# Patient Record
Sex: Female | Born: 1980 | Race: White | Hispanic: No | Marital: Married | Smoking: Never smoker
Health system: Southern US, Community
[De-identification: ages and names within clinical notes are randomized; demographics above are authoritative.]

## PROBLEM LIST (undated history)

## (undated) DIAGNOSIS — E119 Type 2 diabetes mellitus without complications: Secondary | ICD-10-CM

## (undated) DIAGNOSIS — F419 Anxiety disorder, unspecified: Secondary | ICD-10-CM

---

## 2011-11-06 ENCOUNTER — Emergency Department: Payer: Self-pay | Admitting: Emergency Medicine

## 2019-08-08 ENCOUNTER — Ambulatory Visit: Payer: Self-pay | Attending: Internal Medicine

## 2019-08-08 DIAGNOSIS — Z23 Encounter for immunization: Secondary | ICD-10-CM | POA: Insufficient documentation

## 2019-08-08 NOTE — Progress Notes (Signed)
   Covid-19 Vaccination Clinic  Name:  Courtney Fuentes    MRN: [id number] DOB: 1981/06/07  08/08/2019  Courtney Fuentes was observed post Covid-19 immunization for 15 minutes without incidence. She was provided with Vaccine Information Sheet and instruction to access the V-Safe system.   Courtney Fuentes was instructed to call 911 with any severe reactions post vaccine: Marland Kitchen Difficulty breathing  . Swelling of your face and throat  . A fast heartbeat  . A bad rash all over your body  . Dizziness and weakness    Immunizations Administered    Name Date Dose VIS Date Route   Pfizer COVID-19 Vaccine 08/08/2019  4:05 PM 0.3 mL 05/21/2019 Intramuscular   Manufacturer: [employer], [employer]   Lot: [id number]   NDC: [address]-8

## 2019-09-01 ENCOUNTER — Ambulatory Visit: Payer: Self-pay | Attending: Internal Medicine

## 2019-09-01 DIAGNOSIS — Z23 Encounter for immunization: Secondary | ICD-10-CM

## 2019-09-01 NOTE — Progress Notes (Signed)
   Covid-19 Vaccination Clinic  Name:  Courtney Fuentes    MRN: [id number] DOB: 18-Apr-1981  09/01/2019  [name] was observed post Covid-19 immunization for 15 minutes without incident. She was provided with Vaccine Information Sheet and instruction to access the V-Safe system.   [name] was instructed to call 911 with any severe reactions post vaccine: Marland Kitchen Difficulty breathing  . Swelling of face and throat  . A fast heartbeat  . A bad rash all over body  . Dizziness and weakness   Immunizations Administered    Name Date Dose VIS Date Route   Pfizer COVID-19 Vaccine 09/01/2019  2:45 PM 0.3 mL 05/21/2019 Intramuscular   Manufacturer: [employer], [employer]   Lot: [id number]   NDC: [address]-9

## 2019-09-17 ENCOUNTER — Other Ambulatory Visit: Payer: Self-pay | Admitting: Sports Medicine

## 2019-09-17 DIAGNOSIS — M19011 Primary osteoarthritis, right shoulder: Secondary | ICD-10-CM

## 2019-09-17 DIAGNOSIS — M7541 Impingement syndrome of right shoulder: Secondary | ICD-10-CM

## 2019-09-17 DIAGNOSIS — G8929 Other chronic pain: Secondary | ICD-10-CM

## 2019-09-30 ENCOUNTER — Ambulatory Visit: Payer: 59

## 2019-10-11 ENCOUNTER — Ambulatory Visit: Admission: RE | Admit: 2019-10-11 | Payer: 59 | Source: Ambulatory Visit

## 2020-03-17 ENCOUNTER — Other Ambulatory Visit: Payer: Self-pay | Admitting: Sports Medicine

## 2020-03-17 DIAGNOSIS — G8929 Other chronic pain: Secondary | ICD-10-CM

## 2020-03-17 DIAGNOSIS — M19011 Primary osteoarthritis, right shoulder: Secondary | ICD-10-CM

## 2020-03-17 DIAGNOSIS — M25511 Pain in right shoulder: Secondary | ICD-10-CM

## 2020-03-17 DIAGNOSIS — M7541 Impingement syndrome of right shoulder: Secondary | ICD-10-CM

## 2021-02-24 ENCOUNTER — Emergency Department
Admission: EM | Admit: 2021-02-24 | Discharge: 2021-02-24 | Disposition: A | Discharge status: Home / Self Care | Payer: 59 | Attending: Emergency Medicine | Admitting: Emergency Medicine

## 2021-02-24 ENCOUNTER — Encounter: Payer: Self-pay | Admitting: Emergency Medicine

## 2021-02-24 ENCOUNTER — Other Ambulatory Visit: Payer: Self-pay

## 2021-02-24 ENCOUNTER — Emergency Department: Payer: 59

## 2021-02-24 DIAGNOSIS — S8392XA Sprain of unspecified site of left knee, initial encounter: Secondary | ICD-10-CM | POA: Diagnosis not present

## 2021-02-24 DIAGNOSIS — Y9289 Other specified places as the place of occurrence of the external cause: Secondary | ICD-10-CM | POA: Diagnosis not present

## 2021-02-24 DIAGNOSIS — M25562 Pain in left knee: Secondary | ICD-10-CM | POA: Diagnosis not present

## 2021-02-24 DIAGNOSIS — Y9389 Activity, other specified: Secondary | ICD-10-CM | POA: Diagnosis not present

## 2021-02-24 DIAGNOSIS — X503XXA Overexertion from repetitive movements, initial encounter: Secondary | ICD-10-CM | POA: Diagnosis not present

## 2021-02-24 DIAGNOSIS — S80912A Unspecified superficial injury of left knee, initial encounter: Secondary | ICD-10-CM | POA: Diagnosis present

## 2021-02-24 MED ORDER — NAPROXEN 500 MG PO TABS
500.0000 mg | ORAL_TABLET | Freq: Once | ORAL | Status: AC
  Administered 2021-02-24: 500 mg via ORAL
  Filled 2021-02-24: qty 1

## 2021-02-24 MED ORDER — ACETAMINOPHEN 500 MG PO TABS
1000.0000 mg | ORAL_TABLET | Freq: Once | ORAL | Status: AC
  Administered 2021-02-24: 1000 mg via ORAL
  Filled 2021-02-24: qty 2

## 2021-02-24 NOTE — ED Provider Notes (Signed)
[facility] Emergency Department Provider Note ____________________________________________   Event Date/Time   First MD Initiated Contact with Patient 02/24/21 1910     (approximate)  I have reviewed the triage vital signs and the nursing notes.  HISTORY  Chief Complaint Knee Pain   HPI Courtney Fuentes is a 40 y.o. femalewho presents to the ED for evaluation of knee pain.   Chart review indicates morbidly obese patient.  Patient reports that she was getting in her car today when she accidentally had a hyperextension injury to her left knee.  She reports hyperextending her knee, it felt locked outwards and hyperextended.  She reports having difficulty bending her knee and has not bear weight on it due to pain.  Has not taken any medications prior to arrival.  Denies falls to the ground or focal trauma beyond hyperextension.  No past medical history on file.  There are no problems to display for this patient.    Prior to Admission medications   Not on File    Allergies Prednisone  No family history on file.  Social History Social History   Tobacco Use   Smoking status: Never   Smokeless tobacco: Never  Substance Use Topics   Alcohol use: Yes   Drug use: Never    Review of Systems  Constitutional: No fever/chills Eyes: No visual changes. ENT: No sore throat. Cardiovascular: Denies chest pain. Respiratory: Denies shortness of breath. Gastrointestinal: No abdominal pain.  No nausea, no vomiting.  No diarrhea.  No constipation. Genitourinary: Negative for dysuria. Musculoskeletal: Negative for back pain.Marland Kitchen Positive for hyperextension injury to the left knee Skin: Negative for rash. Neurological: Negative for headaches, focal weakness or numbness.   ____________________________________________   PHYSICAL EXAM:  VITAL SIGNS: Vitals:   02/24/21 1709  BP: (!) 123/91  Pulse: (!) 118  Resp: 18  Temp: 97.7 F (36.5 C)  SpO2:  98%      Constitutional: Alert and oriented. Well appearing and in no acute distress.  Morbidly obese, well-appearing. Eyes: Conjunctivae are normal. PERRL. EOMI. Head: Atraumatic. Nose: No congestion/rhinnorhea. Mouth/Throat: Mucous membranes are moist.  Oropharynx non-erythematous. Neck: No stridor. No cervical spine tenderness to palpation. Cardiovascular: Normal rate, regular rhythm. Grossly normal heart sounds.  Good peripheral circulation. Respiratory: Normal respiratory effort.  No retractions. Lungs CTAB. Gastrointestinal: Soft , nondistended, nontender to palpation. No CVA tenderness. Musculoskeletal: No joint effusions.  Keeping her left leg extended, she appears well.  Is full range of motion to left hip, left ankle and foot without external signs of trauma, bruising, open injury or significant swelling. Pain with passive ROM, and refusal to active ROM.  Is tender throughout the patellar and quadriceps tendon without evidence of disruption as the patella is in appropriate positioning at midline.  Some tenderness to the distal quadriceps musculature as well with palpable musculature and spasm. No posterior swelling to the popliteal fossa to suggest a Baker's cyst.  Lesser tenderness posteriorly.   Neurologic:  Normal speech and language. No gross focal neurologic deficits are appreciated. No gait instability noted. Skin:  Skin is warm, dry and intact. No rash noted. Psychiatric: Mood and affect are normal. Speech and behavior are normal.  ____________________________________________   LABS (all labs ordered are listed, but only abnormal results are displayed)  Labs Reviewed - No data to display ____________________________________________  12 Lead EKG   ____________________________________________  RADIOLOGY  ED MD interpretation: Plain film of the left knee reviewed by me without evidence of acute bony injury.  Official radiology report(s): DG Knee Complete 4 Views  Left  Result Date: 02/24/2021 CLINICAL DATA:  Knee injury. EXAM: LEFT KNEE - COMPLETE 4+ VIEW COMPARISON:  None. FINDINGS: No fracture, dislocation, or joint effusion. Mild tricompartmental degenerative changes. IMPRESSION: Mild degenerative changes.  No other abnormalities. Electronically Signed   By: Gerome Sam III M.D.   On: 02/24/2021 17:48    ____________________________________________   PROCEDURES and INTERVENTIONS  Procedure(s) performed (including Critical Care):  Procedures  Medications  acetaminophen (TYLENOL) tablet 1,000 mg (1,000 mg Oral Given 02/24/21 1948)  naproxen (NAPROSYN) tablet 500 mg (500 mg Oral Given 02/24/21 1948)    ____________________________________________   MDM / ED COURSE   40 year old woman presents to the ED with hyperextension injury to the left knee.  No evidence of bony injury, open injury.  No signs of neurologic or vascular deficits.  We will place in a knee immobilizer and have her follow-up with Ortho.     ____________________________________________   FINAL CLINICAL IMPRESSION(S) / ED DIAGNOSES  Final diagnoses:  Acute pain of left knee  Sprain of left knee, unspecified ligament, initial encounter     ED Discharge Orders     None        Laural Eiland   Note:  This document was prepared using Dragon voice recognition software and may include unintentional dictation errors.    Delton Prairie, MD 02/24/21 [phone]

## 2021-02-24 NOTE — Discharge Instructions (Signed)
Please take Tylenol and ibuprofen/Advil for your pain.  It is safe to take them together, or to alternate them every few hours.  Take up to 1000mg  of Tylenol at a time, up to 4 times per day.  Do not take more than 4000 mg of Tylenol in 24 hours.  For ibuprofen, take 400-600 mg, 4-5 times per day.  Wear the knee immobilizer when you are upright.   Follow-up with orthopedics in the clinic, call them on Monday to set up the appointment.  If you develop uncontrolled pain, fevers, inability to use/feel your left foot, please return to the ED.

## 2021-02-24 NOTE — ED Notes (Signed)
Knee immobilizer applied and pt instructed on how to use crutches; pt verbalized she has had crutches before and has knowledge of use; pt discharged via wheelchair

## 2021-02-24 NOTE — ED Triage Notes (Signed)
Pt via POV from home. Pt states that she hyperextended her leg around 3:00pm, pt states it locked up and then she heard a pop. Pt states she is unable to bend her knee and unable to bear weight. Pt is A&Ox4 and NAD. No swelling noted. No deformity noted.

## 2022-02-27 ENCOUNTER — Other Ambulatory Visit: Payer: Self-pay

## 2022-02-27 NOTE — Progress Notes (Signed)
Pt cleared pre-employment UDS, HR notified./CL,RMA 

## 2022-04-27 ENCOUNTER — Emergency Department
Admission: EM | Admit: 2022-04-27 | Discharge: 2022-04-28 | Disposition: A | Discharge status: Home / Self Care | Payer: 59 | Attending: Emergency Medicine | Admitting: Emergency Medicine

## 2022-04-27 ENCOUNTER — Emergency Department: Payer: 59

## 2022-04-27 DIAGNOSIS — S80212A Abrasion, left knee, initial encounter: Secondary | ICD-10-CM | POA: Insufficient documentation

## 2022-04-27 DIAGNOSIS — M25512 Pain in left shoulder: Secondary | ICD-10-CM | POA: Diagnosis not present

## 2022-04-27 DIAGNOSIS — S80211A Abrasion, right knee, initial encounter: Secondary | ICD-10-CM | POA: Insufficient documentation

## 2022-04-27 DIAGNOSIS — M25511 Pain in right shoulder: Secondary | ICD-10-CM | POA: Insufficient documentation

## 2022-04-27 DIAGNOSIS — W19XXXA Unspecified fall, initial encounter: Secondary | ICD-10-CM | POA: Insufficient documentation

## 2022-04-27 DIAGNOSIS — S8992XA Unspecified injury of left lower leg, initial encounter: Secondary | ICD-10-CM | POA: Diagnosis present

## 2022-04-27 DIAGNOSIS — M79642 Pain in left hand: Secondary | ICD-10-CM | POA: Insufficient documentation

## 2022-04-27 DIAGNOSIS — R000 Tachycardia, unspecified: Secondary | ICD-10-CM | POA: Diagnosis not present

## 2022-04-27 DIAGNOSIS — Y9301 Activity, walking, marching and hiking: Secondary | ICD-10-CM | POA: Diagnosis not present

## 2022-04-27 MED ORDER — OXYCODONE-ACETAMINOPHEN 5-325 MG PO TABS
1.0000 | ORAL_TABLET | Freq: Once | ORAL | Status: AC
  Administered 2022-04-27: 1 via ORAL
  Filled 2022-04-27: qty 1

## 2022-04-27 MED ORDER — KETOROLAC TROMETHAMINE 30 MG/ML IJ SOLN
30.0000 mg | Freq: Once | INTRAMUSCULAR | Status: AC
  Administered 2022-04-27: 30 mg via INTRAMUSCULAR
  Filled 2022-04-27: qty 1

## 2022-04-27 NOTE — ED Triage Notes (Signed)
Pt arrived via POV c/o L hand pain and L arm pain.  Pt holding ice pack to arm, decreased ROM.

## 2022-04-27 NOTE — ED Provider Notes (Signed)
[facility] Provider Note  Patient Contact: 10:43 PM (approximate)   History   Fall   HPI  Courtney Fuentes is a 41 y.o. female presents to the emergency department with bilateral shoulder pain and acute left hand pain after patient fell while walking off of her porch.  Patient denies hitting her head or her neck.  No numbness or tingling in the upper and lower extremities.  No chest pain, chest tightness or shortness of breath.  Patient has been able to ambulate since the fall occurred.      Physical Exam   Triage Vital Signs: ED Triage Vitals  Enc Vitals Group     BP 04/27/22 2221 122/88     Pulse Rate 04/27/22 2221 (!) 102     Resp 04/27/22 2221 18     Temp 04/27/22 2217 98.6 F (37 C)     Temp Source 04/27/22 2217 Oral     SpO2 04/27/22 2221 98 %     Weight 04/27/22 2142 256 lb (116.1 kg)     Height 04/27/22 2142 5\' 7"  (1.702 m)     Head Circumference --      Peak Flow --      Pain Score 04/27/22 2142 9     Pain Loc --      Pain Edu? --      Excl. in [place]? --     Most recent vital signs: Vitals:   04/27/22 2217 04/27/22 2221  BP:  122/88  Pulse:  (!) 102  Resp:  18  Temp: 98.6 F (37 C)   SpO2:  98%     General: Alert and in no acute distress. Eyes:  PERRL. EOMI. Head: No acute traumatic findings ENT:      Nose: No congestion/rhinnorhea.      Mouth/Throat: Mucous membranes are moist. Neck: No stridor. No cervical spine tenderness to palpation.  Cardiovascular:  Good peripheral perfusion Respiratory: Normal respiratory effort without tachypnea or retractions. Lungs CTAB. Good air entry to the bases with no decreased or absent breath sounds. Gastrointestinal: Bowel sounds 4 quadrants. Soft and nontender to palpation. No guarding or rigidity. No palpable masses. No distention. No CVA tenderness. Musculoskeletal: Patient performs limited range of motion at the right shoulder.  Patient has pain with range of motion of the left  hand.  Palpable radial and ulnar pulses bilaterally and symmetrically.  Capillary refill less than 2 seconds bilaterally. Neurologic:  No gross focal neurologic deficits are appreciated.  Skin:   No rash noted Other:   ED Results / Procedures / Treatments   Labs (all labs ordered are listed, but only abnormal results are displayed) Labs Reviewed - No data to display     RADIOLOGY  I personally viewed and evaluated these images as part of my medical decision making, as well as reviewing the written report by the radiologist.  ED Provider Interpretation: No acute bony abnormality on x-rays of the left hand and left shoulder.   PROCEDURES:  Critical Care performed: {CriticalCareYesNo:19197::"Yes, see critical care procedure note(s)","No"}  Procedures   MEDICATIONS ORDERED IN ED: Medications  ketorolac (TORADOL) 30 MG/ML injection 30 mg (has no administration in time range)  oxyCODONE-acetaminophen (PERCOCET/ROXICET) 5-325 MG per tablet 1 tablet (has no administration in time range)     IMPRESSION / MDM / ASSESSMENT AND PLAN / ED COURSE  I reviewed the triage vital signs and the nursing notes.  FINAL CLINICAL IMPRESSION(S) / ED DIAGNOSES   Final diagnoses:  None     Rx / DC Orders   ED Discharge Orders     None        Note:  This document was prepared using Dragon voice recognition software and may include unintentional dictation errors.

## 2022-04-28 MED ORDER — MELOXICAM 15 MG PO TABS: 15.0000 mg | ORAL_TABLET | Freq: Every day | ORAL | 0 refills | Status: AC

## 2022-04-28 NOTE — Discharge Instructions (Signed)
Take Meloxicam once daily for pain and inflammation.  

## 2022-08-24 ENCOUNTER — Other Ambulatory Visit: Payer: Self-pay

## 2022-08-24 ENCOUNTER — Encounter: Payer: Self-pay | Admitting: Intensive Care

## 2022-08-24 ENCOUNTER — Emergency Department: Payer: 59

## 2022-08-24 ENCOUNTER — Emergency Department
Admission: EM | Admit: 2022-08-24 | Discharge: 2022-08-24 | Disposition: A | Discharge status: Home / Self Care | Payer: 59 | Attending: Emergency Medicine | Admitting: Emergency Medicine

## 2022-08-24 DIAGNOSIS — Z23 Encounter for immunization: Secondary | ICD-10-CM | POA: Diagnosis not present

## 2022-08-24 DIAGNOSIS — W312XXA Contact with powered woodworking and forming machines, initial encounter: Secondary | ICD-10-CM | POA: Insufficient documentation

## 2022-08-24 DIAGNOSIS — S6992XA Unspecified injury of left wrist, hand and finger(s), initial encounter: Secondary | ICD-10-CM | POA: Diagnosis present

## 2022-08-24 DIAGNOSIS — S62633B Displaced fracture of distal phalanx of left middle finger, initial encounter for open fracture: Secondary | ICD-10-CM | POA: Diagnosis not present

## 2022-08-24 DIAGNOSIS — E119 Type 2 diabetes mellitus without complications: Secondary | ICD-10-CM | POA: Insufficient documentation

## 2022-08-24 DIAGNOSIS — S62639B Displaced fracture of distal phalanx of unspecified finger, initial encounter for open fracture: Secondary | ICD-10-CM

## 2022-08-24 DIAGNOSIS — S60132A Contusion of left middle finger with damage to nail, initial encounter: Secondary | ICD-10-CM | POA: Diagnosis not present

## 2022-08-24 HISTORY — DX: Anxiety disorder, unspecified: F41.9

## 2022-08-24 HISTORY — DX: Type 2 diabetes mellitus without complications: E11.9

## 2022-08-24 MED ORDER — OXYCODONE-ACETAMINOPHEN 5-325 MG PO TABS
1.0000 | ORAL_TABLET | ORAL | Status: DC | PRN
  Administered 2022-08-24: 1 via ORAL
  Filled 2022-08-24: qty 1

## 2022-08-24 MED ORDER — TETANUS-DIPHTH-ACELL PERTUSSIS 5-2.5-18.5 LF-MCG/0.5 IM SUSY
0.5000 mL | PREFILLED_SYRINGE | Freq: Once | INTRAMUSCULAR | Status: AC
  Administered 2022-08-24: 0.5 mL via INTRAMUSCULAR
  Filled 2022-08-24: qty 0.5

## 2022-08-24 MED ORDER — CEPHALEXIN 500 MG PO CAPS: 500.0000 mg | ORAL_CAPSULE | Freq: Four times a day (QID) | ORAL | 0 refills | Status: AC

## 2022-08-24 MED ORDER — OXYCODONE-ACETAMINOPHEN 5-325 MG PO TABS
1.0000 | ORAL_TABLET | Freq: Once | ORAL | Status: AC
  Administered 2022-08-24: 1 via ORAL
  Filled 2022-08-24: qty 1

## 2022-08-24 NOTE — ED Triage Notes (Signed)
Patient reports smashing multiple left hand fingers with her chopsaw. Middle finger is the worst. Reports throbbing pain. Unable to sit still in triage due to pain

## 2022-08-24 NOTE — ED Provider Notes (Signed)
[facility] Provider Note    Event Date/Time   First MD Initiated Contact with Patient 08/24/22 1711     (approximate)   History   Chief Complaint Hand Injury   HPI  Courtney Fuentes is a 42 y.o. female with past medical history of diabetes and anxiety who presents to the ED complaining of hand injury.  Patient reports that just prior to arrival she was using her miter saw when it skipped off of a piece of wood and came down on her left middle finger.  She states it came down right on the nail and she immediately noticed significant bleeding.  She was able to stop the bleeding with pressure and initially went to urgent care, was subsequently referred to the ED for further evaluation.  She is unsure of her last tetanus shot.     Physical Exam   Triage Vital Signs: ED Triage Vitals  Enc Vitals Group     BP 08/24/22 1558 (!) 148/106     Pulse Rate 08/24/22 1558 (!) 114     Resp 08/24/22 1558 16     Temp 08/24/22 1558 98.6 F (37 C)     Temp Source 08/24/22 1558 Oral     SpO2 08/24/22 1558 96 %     Weight 08/24/22 1559 250 lb (113.4 kg)     Height 08/24/22 1559 5\' 7"  (1.702 m)     Head Circumference --      Peak Flow --      Pain Score 08/24/22 1559 10     Pain Loc --      Pain Edu? --      Excl. in [place]? --     Most recent vital signs: Vitals:   08/24/22 1558  BP: (!) 148/106  Pulse: (!) 114  Resp: 16  Temp: 98.6 F (37 C)  SpO2: 96%    Constitutional: Alert and oriented. Eyes: Conjunctivae are normal. Head: Atraumatic. Nose: No congestion/rhinnorhea. Mouth/Throat: Mucous membranes are moist.  Cardiovascular: Normal rate, regular rhythm. Grossly normal heart sounds.  2+ radial pulses bilaterally. Respiratory: Normal respiratory effort.  No retractions. Lungs CTAB. Gastrointestinal: Soft and nontender. No distention. Musculoskeletal: No lower extremity tenderness nor edema.  Small laceration to the lateral distal portion of the left  middle finger, less than 1 cm.  Nail intact with small subungual hematoma, range of motion intact throughout hand.  No lacerations to the other digits. Neurologic:  Normal speech and language. No gross focal neurologic deficits are appreciated.    ED Results / Procedures / Treatments   Labs (all labs ordered are listed, but only abnormal results are displayed) Labs Reviewed - No data to display  RADIOLOGY Left hand x-ray reviewed and interpreted by me with distal tuft fracture of the middle finger.  PROCEDURES:  Critical Care performed: No  Procedures   MEDICATIONS ORDERED IN ED: Medications  Tdap (BOOSTRIX) injection 0.5 mL (0.5 mLs Intramuscular Given 08/24/22 1732)  oxyCODONE-acetaminophen (PERCOCET/ROXICET) 5-325 MG per tablet 1 tablet (1 tablet Oral Given 08/24/22 1732)     IMPRESSION / MDM / ASSESSMENT AND PLAN / ED COURSE  I reviewed the triage vital signs and the nursing notes.                              42 y.o. female with past medical history of diabetes and anxiety who presents to the ED complaining of pain to the tip of  her left middle finger after her miter saw came down onto it.  Patient's presentation is most consistent with acute complicated illness / injury requiring diagnostic workup.  Differential diagnosis includes, but is not limited to, nailbed injury, open fracture, laceration, subungual hematoma.  Patient well-appearing and in no acute distress, vital signs remarkable for tachycardia and hypertension, likely secondary to pain.  X-ray imaging shows displaced distal tuft fracture, no other apparent orthopedic injury.  Her nailbed is intact, she does have a small subungual hematoma but this does not appear to be in need of trephination.  She also has a small laceration that I do not feel would benefit from repair.  Her tetanus was updated and wound was copiously irrigated with saline, finger placed in dressing with splint.  She is appropriate for outpatient  follow-up with hand specialist, will start on Keflex due to concern for open fracture.  Patient agrees with plan.      FINAL CLINICAL IMPRESSION(S) / ED DIAGNOSES   Final diagnoses:  Injury of left hand, initial encounter  Open fracture of tuft of distal phalanx of finger     Rx / DC Orders   ED Discharge Orders          Ordered    cephALEXin (KEFLEX) 500 MG capsule  4 times daily        08/24/22 1748             Note:  This document was prepared using Dragon voice recognition software and may include unintentional dictation errors.   Blake Divine, MD 08/24/22 1754

## 2022-08-24 NOTE — ED Notes (Signed)
Finger splint to middle finger done, wrapped with kerlix.

## 2023-01-24 ENCOUNTER — Emergency Department
Admission: EM | Admit: 2023-01-24 | Discharge: 2023-01-24 | Disposition: A | Discharge status: Home / Self Care | Payer: 59 | Attending: Emergency Medicine | Admitting: Emergency Medicine

## 2023-01-24 ENCOUNTER — Other Ambulatory Visit: Payer: Self-pay

## 2023-01-24 ENCOUNTER — Encounter: Payer: Self-pay | Admitting: Emergency Medicine

## 2023-01-24 ENCOUNTER — Emergency Department: Payer: 59

## 2023-01-24 DIAGNOSIS — J36 Peritonsillar abscess: Secondary | ICD-10-CM | POA: Diagnosis not present

## 2023-01-24 DIAGNOSIS — J029 Acute pharyngitis, unspecified: Secondary | ICD-10-CM | POA: Diagnosis present

## 2023-01-24 LAB — MONONUCLEOSIS SCREEN: Mono Screen: NEGATIVE

## 2023-01-24 LAB — GROUP A STREP BY PCR: Group A Strep by PCR: NOT DETECTED

## 2023-01-24 MED ORDER — IOHEXOL 300 MG/ML  SOLN
75.0000 mL | Freq: Once | INTRAMUSCULAR | Status: AC | PRN
  Administered 2023-01-24: 75 mL via INTRAVENOUS

## 2023-01-24 MED ORDER — KETOROLAC TROMETHAMINE 30 MG/ML IJ SOLN
15.0000 mg | Freq: Once | INTRAMUSCULAR | Status: AC
  Administered 2023-01-24: 15 mg via INTRAVENOUS
  Filled 2023-01-24: qty 1

## 2023-01-24 MED ORDER — AMOXICILLIN-POT CLAVULANATE 875-125 MG PO TABS: 1.0000 | ORAL_TABLET | Freq: Two times a day (BID) | ORAL | 0 refills | Status: AC

## 2023-01-24 MED ORDER — DEXAMETHASONE SODIUM PHOSPHATE 10 MG/ML IJ SOLN
6.0000 mg | Freq: Once | INTRAMUSCULAR | Status: AC
  Administered 2023-01-24: 6 mg via INTRAVENOUS
  Filled 2023-01-24: qty 1

## 2023-01-24 NOTE — ED Triage Notes (Signed)
Patient ambulatory to triage with steady gait, without difficulty or distress noted; pt reports sore throat x 3wks

## 2023-01-24 NOTE — ED Provider Notes (Signed)
[facility] Provider Note    Event Date/Time   First MD Initiated Contact with Patient 01/24/23 0503     (approximate)   History   Sore Throat   HPI  Courtney Fuentes is a 42 y.o. female who presents to the ED for evaluation of Sore Throat   Patient presents for evaluation of 3 weeks of a sore throat.  Reports she went on a trip with her spouse nearly a month ago, her spouse was sick and then patient got sick shortly thereafter with a similar URI syndrome.  She reports she thought the sore throat would improve but it never seemed to improve.  She now reports the sensation of increased pain on the left side of her neck/throat, worsening odynophagia and difficulty swallowing.   Physical Exam   Triage Vital Signs: ED Triage Vitals  Encounter Vitals Group     BP 01/24/23 0325 (!) 133/95     Systolic BP Percentile --      Diastolic BP Percentile --      Pulse Rate 01/24/23 0325 93     Resp 01/24/23 0325 18     Temp 01/24/23 0325 97.8 F (36.6 C)     Temp Source 01/24/23 0325 Oral     SpO2 01/24/23 0325 99 %     Weight 01/24/23 0320 246 lb (111.6 kg)     Height 01/24/23 0320 5\' 7"  (1.702 m)     Head Circumference --      Peak Flow --      Pain Score 01/24/23 0320 8     Pain Loc --      Pain Education --      Exclude from Growth Chart --     Most recent vital signs: Vitals:   01/24/23 0325  BP: (!) 133/95  Pulse: 93  Resp: 18  Temp: 97.8 F (36.6 C)  SpO2: 99%    General: Awake, no distress.  CV:  Good peripheral perfusion.  Resp:  Normal effort.  Abd:  No distention.  MSK:  No deformity noted.  Neuro:  No focal deficits appreciated. Other:  Question mild uvular deviation and fullness of the left tonsillar pillar when compared to the right.  No signs of upper airway obstruction, stridor   ED Results / Procedures / Treatments   Labs (all labs ordered are listed, but only abnormal results are displayed) Labs Reviewed  GROUP A  STREP BY PCR  MONONUCLEOSIS SCREEN    EKG   RADIOLOGY   Official radiology report(s): No results found.  PROCEDURES and INTERVENTIONS:  Procedures  Medications  ketorolac (TORADOL) 30 MG/ML injection 15 mg (15 mg Intravenous Given 01/24/23 0613)  dexamethasone (DECADRON) injection 6 mg (6 mg Intravenous Given 01/24/23 8119)     IMPRESSION / MDM / ASSESSMENT AND PLAN / ED COURSE  I reviewed the triage vital signs and the nursing notes.  Differential diagnosis includes, but is not limited to, PTA, viral syndrome, mononucleosis, strep throat, mass or cancer  {Patient presents with symptoms of an acute illness or injury that is potentially life-threatening.  Patient presents with subacute and acutely worsening sore throat concerning for possible PTA.  Minimal asymmetry to the posterior oropharynx, no signs of upper airway obstruction, but PTA is a concern.  Signed out to oncoming morning PA pending CT. if PTA, she would likely be suitable for outpatient management with close ENT follow-up and initiation of antibiotics.      FINAL CLINICAL IMPRESSION(S) / ED  DIAGNOSES   Final diagnoses:  None     Rx / DC Orders   ED Discharge Orders     None        Note:  This document was prepared using Dragon voice recognition software and may include unintentional dictation errors.   Delton Prairie, MD 01/24/23 [phone]

## 2023-01-24 NOTE — ED Provider Notes (Signed)
----------------------------------------- 7:18 AM on 01/24/2023 -----------------------------------------  Blood pressure (!) 133/95, pulse 93, temperature 97.8 F (36.6 C), temperature source Oral, resp. rate 18, height 5\' 7"  (1.702 m), weight 111.6 kg, last menstrual period 01/10/2023, SpO2 99%.  Assuming care from Dr. Adaline Sill, MD.  In short, Courtney Fuentes is a 42 y.o. female with a chief complaint of Sore Throat Patient has had left-sided sore throat for the past 3 days.  Spouse was sick with similar symptoms.  No trouble handling secretions.  Refer to the original H&P for additional details.  The current plan of care is to await mononucleosis screen and CT soft tissue neck.  ____________________________________________    ED Results / Procedures / Treatments   Labs (all labs ordered are listed, but only abnormal results are displayed) Labs Reviewed  GROUP A STREP BY PCR  MONONUCLEOSIS SCREEN     EKG     RADIOLOGY  I independently reviewed and interpreted imaging and agree with radiologists findings.    CT Soft Tissue Neck W Contrast  Result Date: 01/24/2023 CLINICAL DATA:  Evaluate left peritonsillar abscess. Sore throat for 3 weeks. EXAM: CT NECK WITH CONTRAST TECHNIQUE: Multidetector CT imaging of the neck was performed using the standard protocol following the bolus administration of intravenous contrast. RADIATION DOSE REDUCTION: This exam was performed according to the departmental dose-optimization program which includes automated exposure control, adjustment of the mA and/or kV according to patient size and/or use of iterative reconstruction technique. CONTRAST:  75mL OMNIPAQUE IOHEXOL 300 MG/ML  SOLN COMPARISON:  None Available. FINDINGS: Pharynx and larynx: Tonsillar thickening asymmetric to the left where there is a 6 mm collection. Submucosal edema in the left pharynx and uvula. Normal appearance of the larynx. Salivary glands: Diffuse fatty infiltration.  Asymmetric left submandibular gland atrophy. Thyroid: Mild heterogeneity without discrete nodule or enlargement Lymph nodes: No heterogeneity/cavitation Vascular: Negative Limited intracranial: Negative Visualized orbits: Clear Mastoids and visualized paranasal sinuses: Bilateral nasal cavity polyps. Skeleton: Unremarkable Upper chest: Clear apical lungs IMPRESSION: 6 mm left peritonsillar abscess with regional edema especially affecting the uvula. Electronically Signed   By: Tiburcio Pea M.D.   On: 01/24/2023 07:20     PROCEDURES:  Critical Care performed: No  Procedures   MEDICATIONS ORDERED IN ED: Medications  ketorolac (TORADOL) 30 MG/ML injection 15 mg (15 mg Intravenous Given 01/24/23 0613)  dexamethasone (DECADRON) injection 6 mg (6 mg Intravenous Given 01/24/23 0611)  iohexol (OMNIPAQUE) 300 MG/ML solution 75 mL (75 mLs Intravenous Contrast Given 01/24/23 0706)     IMPRESSION / MDM / ASSESSMENT AND PLAN / ED COURSE  I reviewed the triage vital signs and the nursing notes.                              Differential diagnosis includes, but is not limited to, viral pharyngitis, peritonsillar abscess, retropharyngeal abscess, mononucleosis  Patient's presentation is most consistent with acute presentation with potential threat to life or bodily function.  The patient is on the cardiac monitor to evaluate for evidence of arrhythmia and/or significant heart rate changes.  Patient's diagnosis is consistent with peritonsillar abscess.  This was discussed with Dr. Katrinka Blazing prior to his departure.  Patient will be discharged home with prescriptions for Augmentin.  I had also recommended steroid taper, however patient declines prednisone as it causes her to feel shaky.  Patient is to follow up with ENT this week for recheck. Patient is given ED precautions to  return to the ED for any worsening or new symptoms.  She was advised that this can certainly get larger and she understands return  precautions.     FINAL CLINICAL IMPRESSION(S) / ED DIAGNOSES   Final diagnoses:  Peritonsillar abscess     Rx / DC Orders   ED Discharge Orders          Ordered    amoxicillin-clavulanate (AUGMENTIN) 875-125 MG tablet  2 times daily        01/24/23 0740             Note:  This document was prepared using Dragon voice recognition software and may include unintentional dictation errors.    Keturah Shavers 01/24/23 1610    Minna Antis, MD 01/24/23 1504

## 2023-01-24 NOTE — Discharge Instructions (Addendum)
You have a small peritonsillar abscess.  Please take the antibiotics as prescribed.  Please follow-up with ENT this week, call them today or arrange an appointment. Please return for new, worsening, or change in symptoms or other concerns.

## 2023-01-27 NOTE — ED Notes (Signed)
 Patient received discharge instructions. PIV removed. All questions answered. Patient denied need for wheelchair and is going to ambulate off floor with family member.

## 2023-01-27 NOTE — ED Provider Notes (Signed)
 CEU Attending Note CEU Treatment Team:  Attending Physician: Arlin Curlin Emergency Medicine Provider: Prentice Hazel   CEU Plan/Disposition:  I have seen this patient who is being observed for Pharyngitis. I have personally interviewed and examined the patient. I reviewed and discussed the Clinical Observation Unit findings, interventions, and diagnostic testing with the ED and CEU treatment teams performed on 01/27/2023 and agree with them except as noted in the following documentation: I agree with the findings and treatment plan as presented with exceptions in our respective documentation.  Attestation Statement:   I personally saw the patient and performed a substantive portion of the medical decision making, in conjunction with the Advanced Practice Provider for the condition/treatment of  Courtney Fuentes.   42 yo F presented to the ED for sore throat. Had a CT neck soft tissues that showed asymmetric soft tissue swelling in the L tonsillar cleft concerning for a developing PTA without fluid collection that was large enough to drain. Given duration of symptoms, I&D performed in the ED with symptomatic improvement; pt has been receiving IV abx and feels overall improved. She has no new or worsening symptoms today; is able to talk and tolerate PO, feels well enough to be discharged home. She continues to have throat pain but much improved. Has full ROM of the neck; nontoxic appearing, with stable vitals. Plan for DC w/ abx and ENT follow-up.     PRIYANKA PRAKASH SISTA, MD    Curlin Arlin Hollow, MD 01/29/23 1126

## 2023-01-29 NOTE — Progress Notes (Signed)
 [facility]: Post Discharge Outcomes Assessment   01/29/2023 10:09 AM   Two or more separate outreach attempts have been made to this patient within two business days and have been documented in the medical record. Patient has Medicare, Medicare Advantage, [employer], [employer] or [employer]. Therefore, this patient is TCM eligible.   LEONA WASHINGTON  RN

## 2023-02-05 NOTE — Progress Notes (Signed)
 Review of Systems   Constitutional: Negative.    HENT: Negative.     Eyes: Negative.    Respiratory: Negative.     Cardiovascular: Negative.    Gastrointestinal: Negative.    Endocrine: Negative.    Genitourinary: Negative.    Musculoskeletal: Negative.    Skin: Negative.    Allergic/Immunologic: Negative.    Neurological: Negative.    Hematological: Negative.    Psychiatric/Behavioral: Negative.     All other systems reviewed and are negative.

## 2023-02-18 IMAGING — CR DG KNEE COMPLETE 4+V*L*
1 series · 4 of 4 positions shown · non-contrast
Comparison: None.

CLINICAL DATA: Knee injury.

EXAM:
LEFT KNEE - COMPLETE 4+ VIEW

[Series 1: dg knee complete 4 views left · 0.14mm/px · 4 of 4 slices shown]
[im 1/4]
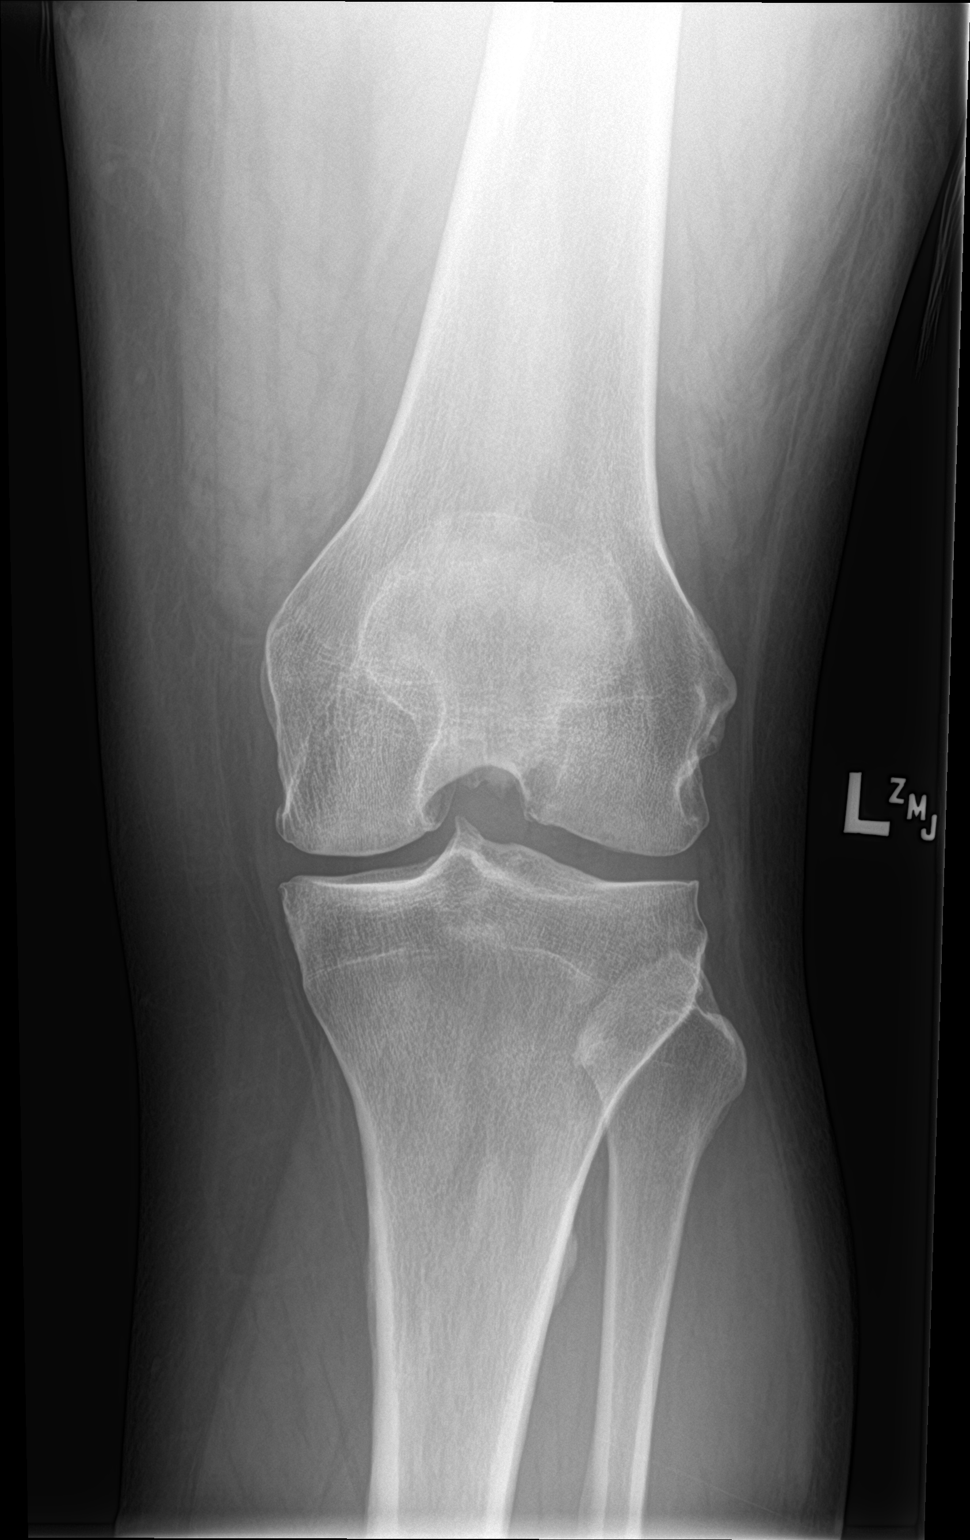
[im 2/4]
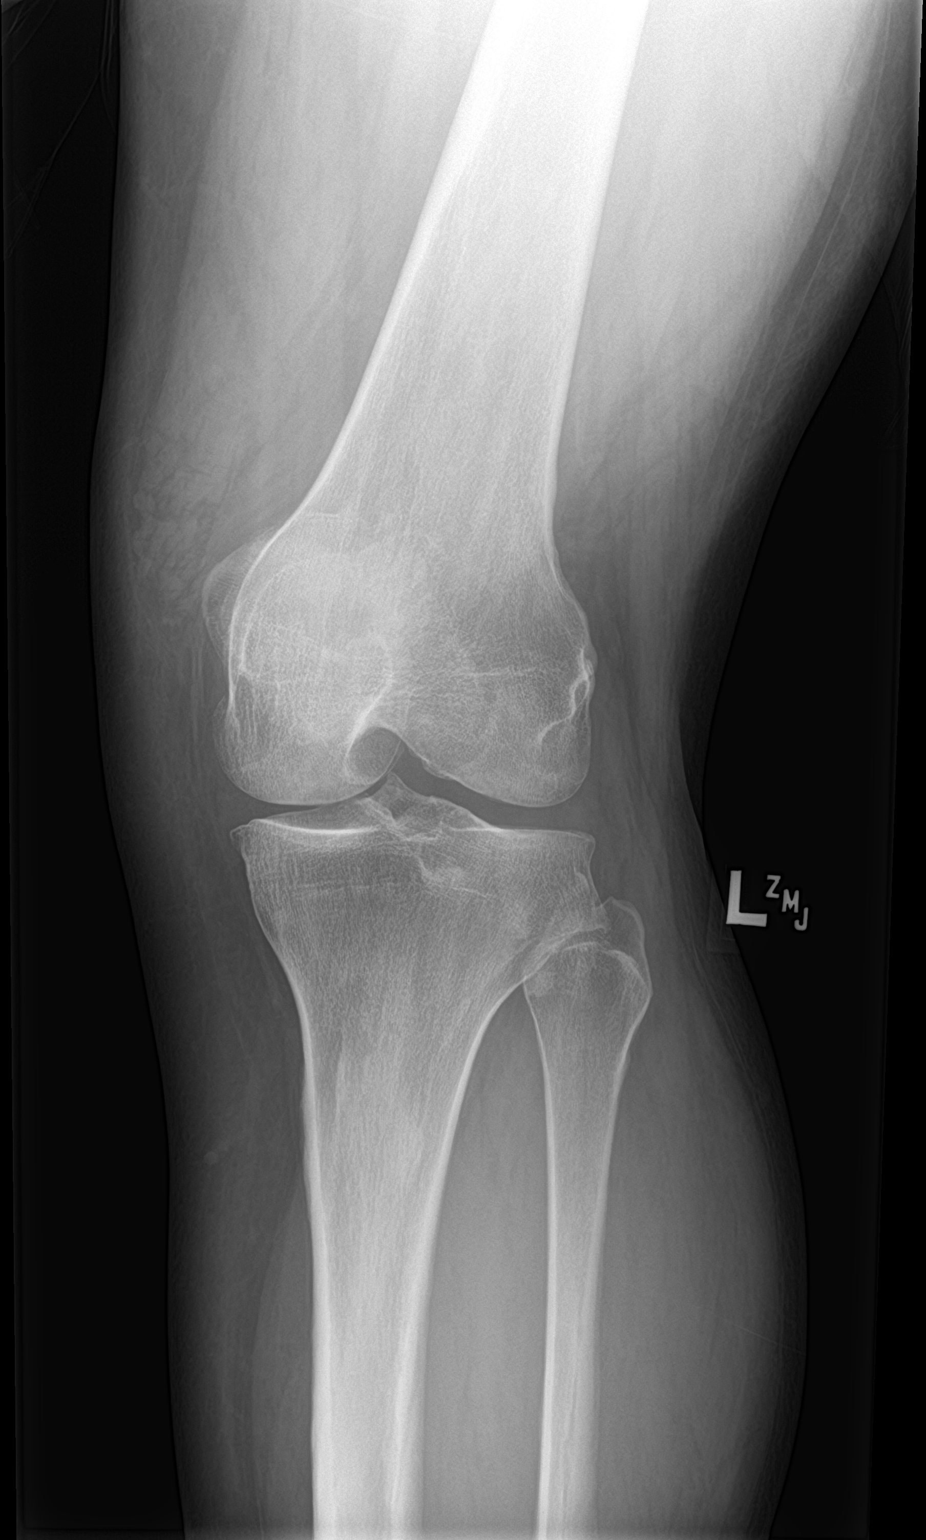
[im 3/4]
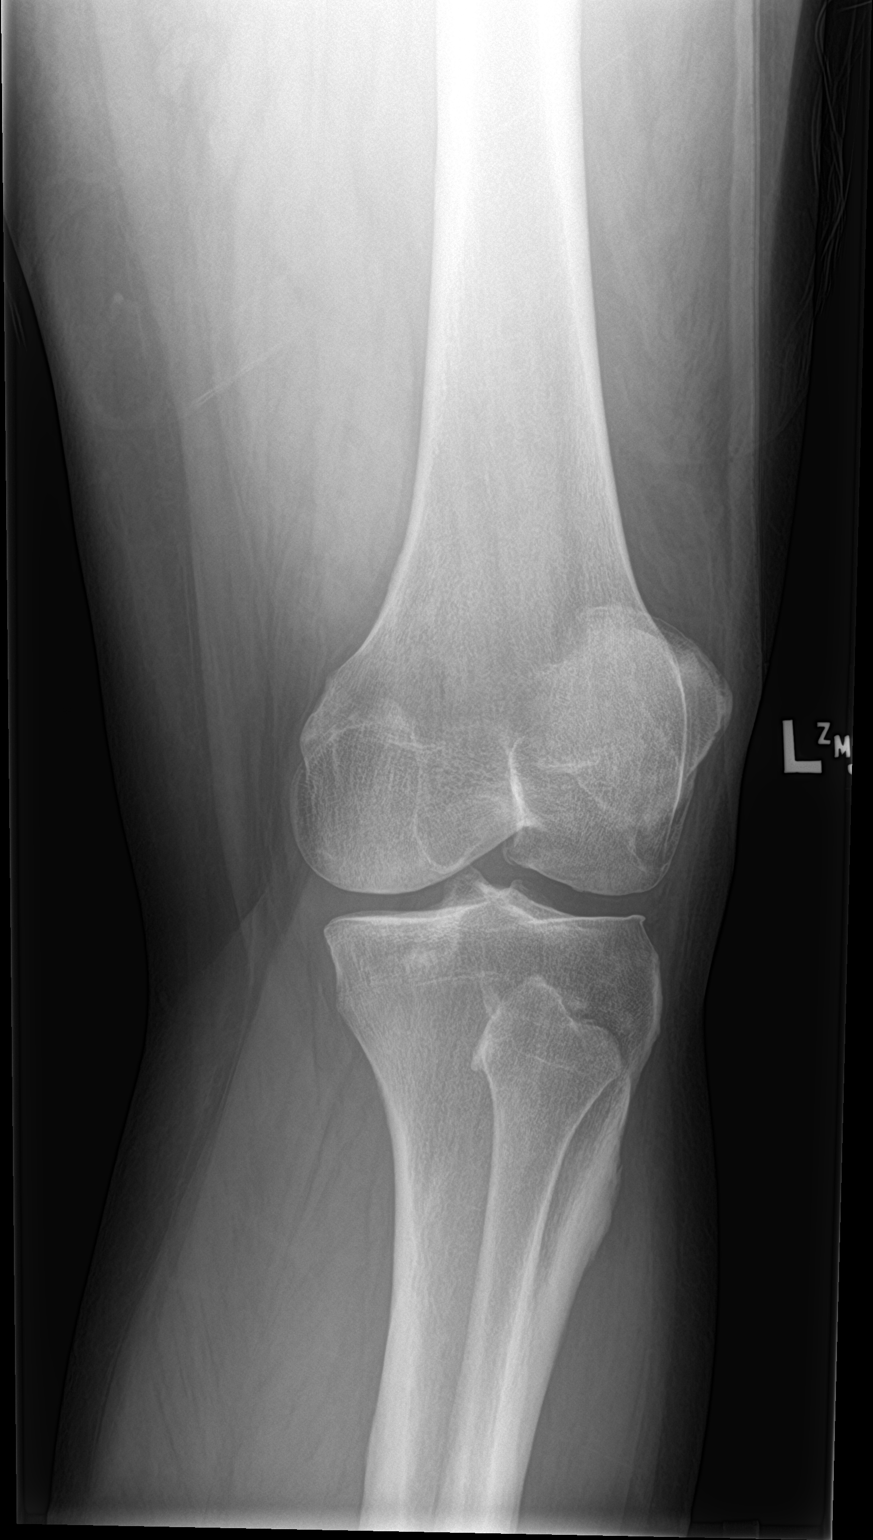
[im 4/4]
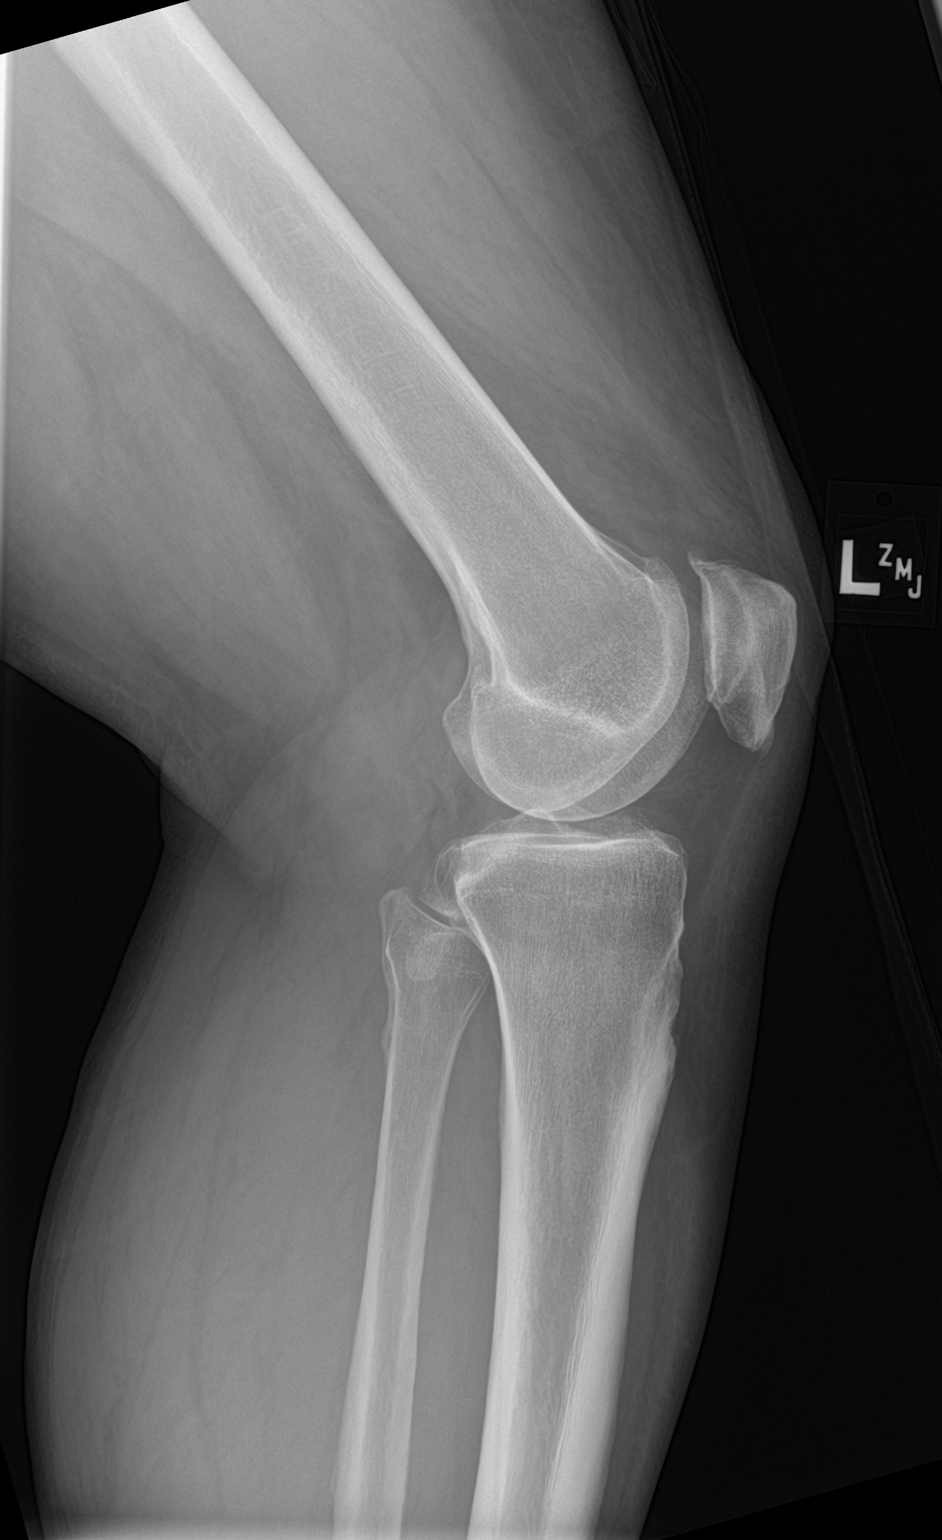

[4 of 4 positions shown; findings below may reference images not displayed]

FINDINGS: No fracture, dislocation, or joint effusion. Mild tricompartmental
degenerative changes.
IMPRESSION: Mild degenerative changes.  No other abnormalities.

## 2023-10-14 NOTE — Progress Notes (Signed)
 Review of Systems  Constitutional:  Negative for chills and fever.  HENT:  Positive for hearing loss. Negative for ear pain and sore throat.   Eyes:  Negative for pain and visual disturbance.  Respiratory:  Negative for cough and shortness of breath.   Cardiovascular:  Negative for chest pain and palpitations.  Gastrointestinal:  Negative for abdominal pain and vomiting.  Genitourinary:  Negative for dysuria and hematuria.  Musculoskeletal:  Negative for arthralgias and back pain.  Skin:  Negative for color change and rash.  Neurological:  Negative for seizures and syncope.  All other systems reviewed and are negative.

## 2023-12-23 ENCOUNTER — Encounter: Payer: Self-pay | Admitting: Physician Assistant

## 2023-12-23 ENCOUNTER — Other Ambulatory Visit: Payer: Self-pay

## 2023-12-23 ENCOUNTER — Emergency Department
Admission: EM | Admit: 2023-12-23 | Discharge: 2023-12-23 | Disposition: A | Discharge status: Home / Self Care | Attending: Emergency Medicine | Admitting: Emergency Medicine

## 2023-12-23 ENCOUNTER — Ambulatory Visit: Payer: Self-pay | Admitting: Physician Assistant

## 2023-12-23 VITALS — BP 138/82 | HR 86 | Temp 97.3°F | Resp 16 | Wt 230.0 lb

## 2023-12-23 DIAGNOSIS — R5383 Other fatigue: Secondary | ICD-10-CM

## 2023-12-23 DIAGNOSIS — Z7984 Long term (current) use of oral hypoglycemic drugs: Secondary | ICD-10-CM | POA: Insufficient documentation

## 2023-12-23 DIAGNOSIS — E162 Hypoglycemia, unspecified: Secondary | ICD-10-CM

## 2023-12-23 DIAGNOSIS — E11649 Type 2 diabetes mellitus with hypoglycemia without coma: Secondary | ICD-10-CM | POA: Insufficient documentation

## 2023-12-23 LAB — BASIC METABOLIC PANEL WITH GFR
Anion gap: 10 (ref 5–15)
BUN: 12 mg/dL (ref 6–20)
CO2: 25 mmol/L (ref 22–32)
Calcium: 9.1 mg/dL (ref 8.9–10.3)
Chloride: 107 mmol/L (ref 98–111)
Creatinine, Ser: 0.42 mg/dL — ABNORMAL LOW (ref 0.44–1.00)
GFR, Estimated: >60 mL/min (ref >60)
Glucose, Bld: 150 mg/dL — ABNORMAL HIGH (ref 70–99)
Potassium: 4.1 mmol/L (ref 3.5–5.1)
Sodium: 142 mmol/L (ref 135–145)

## 2023-12-23 LAB — POCT URINALYSIS DIPSTICK
Bilirubin, UA: NEGATIVE
Blood, UA: NEGATIVE
Glucose, UA: NEGATIVE
Ketones, UA: NEGATIVE
Leukocytes, UA: NEGATIVE
Nitrite, UA: NEGATIVE
Protein, UA: NEGATIVE
Spec Grav, UA: 1.01 (ref 1.010–1.025)
Urobilinogen, UA: 0.2 U/dL
pH, UA: 6 (ref 5.0–8.0)

## 2023-12-23 LAB — URINALYSIS, ROUTINE W REFLEX MICROSCOPIC
Bilirubin Urine: NEGATIVE
Glucose, UA: NEGATIVE mg/dL
Hgb urine dipstick: NEGATIVE
Ketones, ur: NEGATIVE mg/dL
Leukocytes,Ua: NEGATIVE
Nitrite: NEGATIVE
Protein, ur: NEGATIVE mg/dL
Specific Gravity, Urine: 1.004 — ABNORMAL LOW (ref 1.005–1.030)
pH: 7 (ref 5.0–8.0)

## 2023-12-23 LAB — CBC
HCT: 38.1 % (ref 36.0–46.0)
Hemoglobin: 12.2 g/dL (ref 12.0–15.0)
MCH: 28.5 pg (ref 26.0–34.0)
MCHC: 32 g/dL (ref 30.0–36.0)
MCV: 89 fL (ref 80.0–100.0)
Platelets: 281 K/uL (ref 150–400)
RBC: 4.28 MIL/uL (ref 3.87–5.11)
RDW: 14.9 % (ref 11.5–15.5)
WBC: 8 K/uL (ref 4.0–10.5)
nRBC: 0 % (ref 0.0–0.2)

## 2023-12-23 LAB — CBG MONITORING, ED
Glucose-Capillary: 142 mg/dL — ABNORMAL HIGH (ref 70–99)
Glucose-Capillary: 92 mg/dL (ref 70–99)
Glucose-Capillary: 98 mg/dL (ref 70–99)
Glucose-Capillary: 88 mg/dL (ref 70–99)
Glucose-Capillary: 90 mg/dL (ref 70–99)

## 2023-12-23 MED ORDER — IBUPROFEN 600 MG PO TABS: 600.0000 mg | ORAL_TABLET | Freq: Three times a day (TID) | ORAL | 0 refills | Status: AC | PRN

## 2023-12-23 MED ORDER — IBUPROFEN 600 MG PO TABS
600.0000 mg | ORAL_TABLET | Freq: Once | ORAL | Status: AC
  Administered 2023-12-23: 600 mg via ORAL
  Filled 2023-12-23: qty 1

## 2023-12-23 NOTE — Telephone Encounter (Signed)
 error

## 2023-12-23 NOTE — Telephone Encounter (Signed)
 This was addressed in another encounter.

## 2023-12-23 NOTE — Progress Notes (Signed)
   Subjective:Hypoglycemic    Patient ID: Courtney Fuentes, female    DOB: 1981-03-10, 43 y.o.   MRN: [id number]  HPI Patient presents to the clinic feeling lethargic.  Patient is a type II diabetic who takes metformin 500 mg twice daily.  Patient states she has been on intermittent fasting regiment for about 2 weeks.  Patient still normal ranges between 90 and 120.  Patient stated last 24 hours even ranged between 50 and 60.  Patient contacted PCP and was told to hold off on the metformin if she gets any worse to go to the emergency room.  Patient elected to come to the clinic.  Review of Systems Negative except for above complaint    Objective:   Physical Exam BP 138/82  Pulse Rate 86  Temp 97.3 F (36.3 C)  Weight 230 lb (104.3 kg)  Resp 16  SpO2 98 %  Current blood sugar this 51.      Component Ref Range & Units (hover) 14:19  Color, UA yellow  Clarity, UA clear  Glucose, UA Negative  Bilirubin, UA neg  Ketones, UA neg  Spec Grav, UA 1.010  Blood, UA neg  pH, UA 6.0  Protein, UA Negative  Urobilinogen, UA 0.2  Nitrite, UA neg  Leukocytes, UA Negative  Appearance light  Odor none               Assessment & Plan: Hypoglycemia  Agree with patient's PCP to go to the emergency room for definitive evaluation and treatment.  Patient will be escorted by a coworker.

## 2023-12-23 NOTE — Telephone Encounter (Signed)
 No Actions Required Documentation only     Reason for mychart message: Patient sent a message about :  [facility] Mychart Centralized [facility] (supporting Oneil Prentice Galloway, MD)2 hours ago (9:12 AM)   Hello, I have been having low sugar episodes in the last 24 hrs. Nothing seems to be getting it up and keeping it up, it'll go right back down and my alarms will sound again. I have not changed any of my medications. I have been fasting (16/8) since June, but its been a month since I started that, so I'm not sure if it has anything to do with the levels. I'm experiencing an overall yucky feeling when they dip low and lethargy. Should I be concerned?  Attachments  [email or web address] <redacted file path>   [id number] <redacted file path>   [email or web address] <redacted file path>      Actions taken: LVM for pt to return call regarding [facility].     Nat FORBES Croft, RN [facility] Patient Engagement Center  Patient Engagement Center Documentation

## 2023-12-23 NOTE — Telephone Encounter (Signed)
 Panel Manager Take Action Suggestions: Call patient back, about clinic [facility]    Reason for mychart message: Patient sent a message about :  Courtney Fuentes  P Mychart Centralized [facility] (supporting Oneil Prentice Galloway, MD)2 hours ago (9:12 AM)   Hello, I have been having low sugar episodes in the last 24 hrs. Nothing seems to be getting it up and keeping it up, it'll go right back down and my alarms will sound again. I have not changed any of my medications. I have been fasting (16/8) since June, but its been a month since I started that, so I'm not sure if it has anything to do with the levels. I'm experiencing an overall yucky feeling when they dip low and lethargy. Should I be concerned?  Attachments  [email or web address] <redacted file path>   [email or web address] <redacted file path>   [id number] <redacted file path>    Actions taken: LVM for pt asking her to return call in regards to [facility].     Nat FORBES Croft, RN [facility] Patient Engagement Center  Patient Engagement Center Documentation

## 2023-12-23 NOTE — ED Notes (Signed)
 Pt had BG monitoring device placed about 1 week ago and within the last 24 hrs, has had several low BG  level readings. Pt states that she is feeling surprisingly well at this time. Last BG was around 90. Pt was endorsing dizziness, lethargic, and general unwell at first, but symptoms have improved. Pt ABCs intact. RR even and unlabored. Pt in NAD. Bed in lowest locked position. Call bell in reach. Denies needs at this time.   Past Medical History:  Diagnosis Date   Anxiety    Diabetes mellitus without complication (HCC)

## 2023-12-23 NOTE — ED Notes (Signed)
 Pt reports that for the last 24 hours her CBG has been all over the place. Prior to coming to the ED she was bouncing around 54-67, and pt had some glucose tabs and a smoothie to help bring her sugar up. Pt is A&Ox4 and ambulatory. Pt c/o bilateral hip pain that she states is a flair up that she deals with when stressed.

## 2023-12-23 NOTE — ED Triage Notes (Addendum)
 Pt comes with hypoglycemia. Pt states she has pump and it has been running low since yesterday. Pt states she can't get it to regulate. Pt states she has eaten today.   Pt states nausea and feels yucky.  Sugars showing 54-69 on her phone  Current cbg 142 by our reading. pt states she just had half smoothie, two liquid waters and two gluco tabs.  Pt phone showing 67 as reading

## 2023-12-23 NOTE — ED Notes (Signed)
 Pt provided with a few packs of graham crackers to snack on. Pt ABCs intact. RR even and unlabored. Pt in NAD. Bed in lowest locked position. Call bell in reach. Denies needs at this time.

## 2023-12-23 NOTE — ED Provider Notes (Signed)
 [facility] Provider Note   Event Date/Time   First MD Initiated Contact with Patient 12/23/23 1629     (approximate) History  Hypoglycemia  HPI Courtney Fuentes is a 43 y.o. female with a past medical history of type 2 diabetes on metformin and Ozempic who presents complaining of hypoglycemia over the last 24 hours stating that her blood glucose monitor has showed from 50s to 60s.  Patient endorses associated weakness, nausea, and headache over this time.  Patient states that she has been under an increasing amount of stress recently as well as is currently on a intermittent fasting diet of 16 hours fasting and 8 hours eating.  Patient does not track her total carbs during this feeding window. ROS: Patient currently denies any vision changes, tinnitus, difficulty speaking, facial droop, sore throat, chest pain, shortness of breath, abdominal pain, nausea/vomiting/diarrhea, dysuria, or numbness/paresthesias in any extremity   Physical Exam  Triage Vital Signs: ED Triage Vitals  Encounter Vitals Group     BP 12/23/23 1515 (!) 150/98     Girls Systolic BP Percentile --      Girls Diastolic BP Percentile --      Boys Systolic BP Percentile --      Boys Diastolic BP Percentile --      Pulse Rate 12/23/23 1515 88     Resp 12/23/23 1515 18     Temp 12/23/23 1515 98 F (36.7 C)     Temp src --      SpO2 12/23/23 1515 100 %     Weight 12/23/23 1514 230 lb (104.3 kg)     Height 12/23/23 1514 5' 7 (1.702 m)     Head Circumference --      Peak Flow --      Pain Score 12/23/23 1514 8     Pain Loc --      Pain Education --      Exclude from Growth Chart --    Most recent vital signs: Vitals:   12/23/23 1800 12/23/23 1957  BP: (!) 140/95 119/67  Pulse: 89 85  Resp: 17 16  Temp:  98 F (36.7 C)  SpO2: 98% 100%   General: Awake, oriented x4. CV:  Good peripheral perfusion. Resp:  Normal effort. Abd:  No distention. Other:  Middle-aged obese Caucasian  female resting comfortably in no acute distress ED Results / Procedures / Treatments  Labs (all labs ordered are listed, but only abnormal results are displayed) Labs Reviewed  BASIC METABOLIC PANEL WITH GFR - Abnormal; Notable for the following components:      Result Value   Glucose, Bld 150 (*)    Creatinine, Ser 0.42 (*)    All other components within normal limits  URINALYSIS, ROUTINE W REFLEX MICROSCOPIC - Abnormal; Notable for the following components:   Color, Urine STRAW (*)    APPearance CLEAR (*)    Specific Gravity, Urine 1.004 (*)    All other components within normal limits  CBG MONITORING, ED - Abnormal; Notable for the following components:   Glucose-Capillary 142 (*)    All other components within normal limits  CBC  CBG MONITORING, ED  CBG MONITORING, ED  CBG MONITORING, ED  CBG MONITORING, ED  CBG MONITORING, ED  CBG MONITORING, ED  CBG MONITORING, ED  CBG MONITORING, ED  CBG MONITORING, ED  CBG MONITORING, ED  CBG MONITORING, ED   PROCEDURES: Critical Care performed: No Procedures MEDICATIONS ORDERED IN ED: Medications  ibuprofen  (ADVIL ) tablet  600 mg (600 mg Oral Given 12/23/23 1824)   IMPRESSION / MDM / ASSESSMENT AND PLAN / ED COURSE  I reviewed the triage vital signs and the nursing notes.                             The patient is on the cardiac monitor to evaluate for evidence of arrhythmia and/or significant heart rate changes. Patient's presentation is most consistent with acute presentation with potential threat to life or bodily function. Type II diabetic presents BIBA for hypoglycemia with lethargy and headache  Given History, Exam, and Workup I have a low suspicion for sepsis induced hypoglycemia, long lasting medication overdose, suicidal ideation/purposeful overdose attempt.  Reassessment: After oral intake, patient maintained adequate glucose levels throughout 4-hour observation After observation in the emergency department for several  hours, the Blood sugar has remained stable, and based on the history is expected that the Blood sugar should remain stable after discharge. Disposition: Plan discharge home with close primary care follow-up   FINAL CLINICAL IMPRESSION(S) / ED DIAGNOSES   Final diagnoses:  Hypoglycemia   Rx / DC Orders   ED Discharge Orders          Ordered    ibuprofen  (ADVIL ) 600 MG tablet  Every 8 hours PRN        12/23/23 1818           Note:  This document was prepared using Dragon voice recognition software and may include unintentional dictation errors.   Weyman Bogdon K, MD 12/23/23 2157

## 2023-12-23 NOTE — Progress Notes (Signed)
 Reports feeling increased lethargy and generalized yuckiness while at work and reports being type 2 DM approximately 7 years.  Per baseline does a 16/8 fast for eating and normally tolerates well.  Started feeling badly yesterday and increasingly feeling worse when eating carbs.  Monitors blood sugar with Freestyle libre today's reading are currently at 68 at this time at appt and was down to 54 this morning.  UA obtained today, but denies any complaints of anything out of baseline.  Reports current A1C around 6.1 and takes meds as prescribed.

## 2023-12-23 NOTE — ED Notes (Signed)
 Pt discharged to ED circle at this time and left with all belongings. Pt ABCs intact. RR even and unlabored. Pt in NAD. Pt denies further needs from this RN.

## 2023-12-23 NOTE — Telephone Encounter (Signed)
 Provider FYI ONLY    Reason for call: Patient is calling for:  Chief Complaint  Patient presents with  . Hypoglycemia    MCM sent earlier today BG ranging in 50-60's > 24 hrs Pt has been eating PB cracker, chips, chocolate and unable to keep BG WNL range Baseline: 90-120  BG-67 @ 1257 (time of call) BG- 83 @ 1305 S/s: sweaty, lethargic, yucky /overall malaise ** Pt did take Rx: Metformin this AM ** Advised w/ pt to discuss possible hold parameters **       Actions taken during call: Patient advised to call 911 if having an emergency or have someone assist with transportation to the nearest Emergency Room.  Routed encounter to Provider Scheduled Appointments - Pt will call 911/go to ED now given s/s. BG is now WNL but given past 24 hrs and s/s advised 911.  Future Appointments     Date/Time Provider Department Center Visit Type   12/25/2023 4:00 PM (Arrive by 3:45 PM) Laurice Oneil Barter, MD [facility] SAME DAY   12/30/2023 12:00 PM (Arrive by 11:45 AM) Annelle Senior, PT [facility] PT   12/31/2023 1:30 PM (Arrive by 1:15 PM) Courtney Dedra Bence, PA [facility] [place] [facility] PRIMARY CARE ACCESS   01/13/2024 9:00 AM (Arrive by 8:45 AM) Karna Redell Billing, MD [facility]       Triage: Triage completed, care advice given per protocol. Triage:Call back parameters given and caller advised of 24 hour nurse triage. Instructed to seek immediate medical attention if new symptoms develop, current symptoms worsen or if you become increasingly concerned. Patient verbalized understanding.     KATHERINE BUCKLEY, RN [facility] Patient Engagement Center  Patient Engagement Center Documentation    Reason for Disposition . Patient sounds very sick or weak to the triager  Additional Information . Negative:  Unconscious or difficult to awaken . Negative: Seizure occurs . Negative: Acting confused (e.g., disoriented, slurred speech) . Negative: Very weak (can't stand) . Negative: Sounds like a life-threatening emergency to the triager . Negative: Vomiting and signs of dehydration (e.g., no urine > 12 hours, very dry mouth, dark urine, etc.) . Negative: Low blood sugar symptoms persist > 30 minutes AND using low blood sugar Care Advice . Negative: Low blood glucose persists > 30 minutes AND using low blood sugar Care Advice  Answer Assessment - Initial Assessment Questions 1. SYMPTOMS: What symptoms are you concerned about?     Sweaty, lethargic, yuckiness  2. ONSET:  When did the symptoms start?     24 hrs ago  3. BLOOD GLUCOSE: What is your blood glucose level?      67 4. USUAL RANGE: What is your blood glucose level usually? (e.g., usual fasting morning value, usual evening value)     90-120 5. TYPE 1 or 2:  Do you know what type of diabetes you have?  (e.g., Type 1, Type 2, Gestational; doesn't know)      2 6. INSULIN: Do you take insulin?      No  7. DIABETES PILLS: Do you take any pills for your diabetes?     metformin 8. OTHER SYMPTOMS: Do you have any symptoms? (e.g., fever, frequent urination, difficulty breathing, vomiting)     no 9. LOW BLOOD GLUCOSE TREATMENT: What have you done so far to treat the low blood  glucose level?     PB + crackers, little chocolate, chips 10. ALONE: Are you alone right now or is someone with you?        At work  11. PREGNANCY: Is there any chance you are pregnant? When was your last menstrual period?       no  Protocols used: Diabetes - Low Blood Sugar-A-OH

## 2023-12-24 ENCOUNTER — Other Ambulatory Visit: Payer: Self-pay | Admitting: Physician Assistant

## 2024-05-05 ENCOUNTER — Other Ambulatory Visit: Payer: Self-pay

## 2024-05-05 ENCOUNTER — Emergency Department
Admission: EM | Admit: 2024-05-05 | Discharge: 2024-05-06 | Disposition: A | Discharge status: Home / Self Care | Attending: Emergency Medicine | Admitting: Emergency Medicine

## 2024-05-05 DIAGNOSIS — E119 Type 2 diabetes mellitus without complications: Secondary | ICD-10-CM | POA: Diagnosis not present

## 2024-05-05 DIAGNOSIS — R10A2 Flank pain, left side: Secondary | ICD-10-CM | POA: Insufficient documentation

## 2024-05-05 LAB — BASIC METABOLIC PANEL WITH GFR
Anion gap: 9 (ref 5–15)
BUN: 15 mg/dL (ref 6–20)
CO2: 27 mmol/L (ref 22–32)
Calcium: 9.5 mg/dL (ref 8.9–10.3)
Chloride: 105 mmol/L (ref 98–111)
Creatinine, Ser: 0.52 mg/dL (ref 0.44–1.00)
GFR, Estimated: >60 mL/min (ref >60)
Glucose, Bld: 116 mg/dL — ABNORMAL HIGH (ref 70–99)
Potassium: 3.7 mmol/L (ref 3.5–5.1)
Sodium: 141 mmol/L (ref 135–145)

## 2024-05-05 LAB — CBC
HCT: 34.8 % — ABNORMAL LOW (ref 36.0–46.0)
Hemoglobin: 11.3 g/dL — ABNORMAL LOW (ref 12.0–15.0)
MCH: 28 pg (ref 26.0–34.0)
MCHC: 32.5 g/dL (ref 30.0–36.0)
MCV: 86.4 fL (ref 80.0–100.0)
Platelets: 282 K/uL (ref 150–400)
RBC: 4.03 MIL/uL (ref 3.87–5.11)
RDW: 14.3 % (ref 11.5–15.5)
WBC: 8.9 K/uL (ref 4.0–10.5)
nRBC: 0 % (ref 0.0–0.2)

## 2024-05-05 MED ORDER — MORPHINE SULFATE (PF) 4 MG/ML IV SOLN
4.0000 mg | Freq: Once | INTRAVENOUS | Status: AC
  Administered 2024-05-05: 4 mg via INTRAVENOUS
  Filled 2024-05-05: qty 1

## 2024-05-05 MED ORDER — ONDANSETRON HCL 4 MG/2ML IJ SOLN
4.0000 mg | Freq: Once | INTRAMUSCULAR | Status: AC
  Administered 2024-05-05: 4 mg via INTRAVENOUS
  Filled 2024-05-05: qty 2

## 2024-05-05 MED ORDER — MORPHINE SULFATE (PF) 4 MG/ML IV SOLN
4.0000 mg | Freq: Once | INTRAVENOUS | Status: AC
  Administered 2024-05-06: 4 mg via INTRAVENOUS
  Filled 2024-05-05: qty 1

## 2024-05-05 MED ORDER — SODIUM CHLORIDE 0.9 % IV BOLUS
1000.0000 mL | Freq: Once | INTRAVENOUS | Status: AC
  Administered 2024-05-05: 1000 mL via INTRAVENOUS

## 2024-05-05 NOTE — ED Provider Notes (Signed)
 [facility] Emergency Department Provider Note     Event Date/Time   First MD Initiated Contact with Patient 05/05/24 2116     (approximate)   History   Flank Pain (L)   HPI  Courtney Fuentes is a 43 y.o. female with a past medical history of diabetes and anxiety presents to the ED for evaluation of sudden left flank pain with onset of today.  Patient describes the pain as sharp.  She reports similar pain to prior kidney stone.  Denies dysuria or urinary frequency or hematuria but does state urine was cloudy when she was able to go around 430pm today.  Associated symptoms includes nausea without vomiting.  No other complaint at this time     Physical Exam   Triage Vital Signs: ED Triage Vitals [05/05/24 2018]  Encounter Vitals Group     BP (!) 155/93     Girls Systolic BP Percentile      Girls Diastolic BP Percentile      Boys Systolic BP Percentile      Boys Diastolic BP Percentile      Pulse Rate (!) 101     Resp 20     Temp 98.6 F (37 C)     Temp Source Oral     SpO2 99 %     Weight      Height      Head Circumference      Peak Flow      Pain Score 8     Pain Loc      Pain Education      Exclude from Growth Chart     Most recent vital signs: Vitals:   05/05/24 2018  BP: (!) 155/93  Pulse: (!) 101  Resp: 20  Temp: 98.6 F (37 C)  SpO2: 99%    General Awake, no distress. HEENT NCAT.  CV:  Good peripheral perfusion.  RESP:  Normal effort.  ABD:  No distention.  Soft, nontender.  Positive left CVA tenderness  ED Results / Procedures / Treatments   Labs (all labs ordered are listed, but only abnormal results are displayed) Labs Reviewed  CBC - Abnormal; Notable for the following components:      Result Value   Hemoglobin 11.3 (*)    HCT 34.8 (*)    All other components within normal limits  BASIC METABOLIC PANEL WITH GFR - Abnormal; Notable for the following components:   Glucose, Bld 116 (*)    All other  components within normal limits  URINALYSIS, ROUTINE W REFLEX MICROSCOPIC  PREGNANCY, URINE  HCG, QUANTITATIVE, PREGNANCY   No results found.  PROCEDURES:  Critical Care performed: No  Procedures   MEDICATIONS ORDERED IN ED: Medications  sodium chloride  0.9 % bolus 1,000 mL (1,000 mLs Intravenous New Bag/Given 05/05/24 2214)  morphine  (PF) 4 MG/ML injection 4 mg (4 mg Intravenous Given 05/05/24 2215)  ondansetron  (ZOFRAN ) injection 4 mg (4 mg Intravenous Given 05/05/24 2215)     IMPRESSION / MDM / ASSESSMENT AND PLAN / ED COURSE  I reviewed the triage vital signs and the nursing notes.                               43 y.o. female presents to the emergency department for evaluation and treatment of sudden left flank pain. See HPI for further details.   Differential diagnosis includes, but is not limited to nephrolithiasis, UTI,  pyelonephritis, colitis  Patient's presentation is most consistent with acute complicated illness / injury requiring diagnostic workup.  Physical exam findings are stated above pertinent for positive left CVA tenderness.  Plan will be to obtain basic labs to check kidney function and WBC, urinalysis and CT renal study.  ----------------------------------------- 11:20 PM on 05/05/2024 ----------------------------------------- Patient is unable to urinate; will obtain hCG for pregnancy test.  Transferring patient care to Dr. Cyrena who will await CT renal stone study and make appropriate disposition.  Patient is in stable condition at this time.  FINAL CLINICAL IMPRESSION(S) / ED DIAGNOSES   Final diagnoses:  None   Rx / DC Orders   ED Discharge Orders     None        Note:  This document was prepared using Dragon voice recognition software and may include unintentional dictation errors.    Margrette, Cayce Paschal A, PA-C 05/05/24 2321    Levander Slate, MD 05/06/24 [phone]

## 2024-05-05 NOTE — ED Triage Notes (Signed)
 Pt reports left lower flank pain that started this afternoon. No injury/trauma. Pain feels similar to previous kidney stones in the past. Associated with cloudy urine and nausea.

## 2024-05-05 NOTE — ED Notes (Signed)
 Pt unable to provide urine sample at this time. Provided with urine specimen cup

## 2024-05-06 ENCOUNTER — Emergency Department

## 2024-05-06 LAB — URINALYSIS, ROUTINE W REFLEX MICROSCOPIC
Bilirubin Urine: NEGATIVE
Glucose, UA: NEGATIVE mg/dL
Hgb urine dipstick: NEGATIVE
Ketones, ur: NEGATIVE mg/dL
Leukocytes,Ua: NEGATIVE
Nitrite: NEGATIVE
Protein, ur: NEGATIVE mg/dL
Specific Gravity, Urine: 1.017 (ref 1.005–1.030)
pH: 6 (ref 5.0–8.0)

## 2024-05-06 LAB — PREGNANCY, URINE: Preg Test, Ur: NEGATIVE

## 2024-05-06 LAB — HCG, QUANTITATIVE, PREGNANCY: hCG, Beta Chain, Quant, S: <1 m[IU]/mL (ref <5)

## 2024-05-06 NOTE — ED Provider Notes (Signed)
 Left-sided flank pain, signed out to me pending CT and urinalysis and reassessment.  Pain moderately improved with medications.  CT negative for any acute pathologies.  Urinalysis unremarkable.  No ovarian or adnexal enlargement noted on CT scan I think the probability of ovarian torsion is exceedingly low.  Perhaps a passed stone or lumbar strain.  Given unremarkable workup, plan will be for discharge and PMD follow-up.   Cyrena Mylar, MD 05/06/24 0200

## 2024-05-06 NOTE — Discharge Instructions (Addendum)
 Fortunately your evaluation in the emergency department did not show any emergency conditions like urine infections, kidney infections, abdominal blockages or infections, or kidney stones.  I am not sure what is causing your pain but it may reflect a muscle strain or a kidney stone that has now passed out of the body.  Take acetaminophen  650 mg and ibuprofen  400 mg every 6 hours for pain.  Take with food.  Thank you for choosing us  for your health care today!  Please see your primary doctor this week for a follow up appointment.   If you have any new, worsening, or unexpected symptoms call your doctor right away or come back to the emergency department for reevaluation.  It was my pleasure to care for you today.   Ginnie EDISON Cyrena, MD
